# Patient Record
Sex: Male | Born: 1970 | Race: White | Hispanic: No | Marital: Married | Smoking: Former smoker
Health system: Southern US, Community
[De-identification: ages and names within clinical notes are randomized; demographics above are authoritative.]

## PROBLEM LIST (undated history)

## (undated) DIAGNOSIS — I10 Essential (primary) hypertension: Secondary | ICD-10-CM

---

## 2011-11-01 ENCOUNTER — Encounter (HOSPITAL_BASED_OUTPATIENT_CLINIC_OR_DEPARTMENT_OTHER): Payer: Self-pay | Admitting: *Deleted

## 2011-11-01 ENCOUNTER — Emergency Department (INDEPENDENT_AMBULATORY_CARE_PROVIDER_SITE_OTHER): Payer: BC Managed Care – PPO

## 2011-11-01 ENCOUNTER — Emergency Department (HOSPITAL_BASED_OUTPATIENT_CLINIC_OR_DEPARTMENT_OTHER)
Admission: EM | Admit: 2011-11-01 | Discharge: 2011-11-01 | Disposition: A | Discharge status: Home / Self Care | Payer: BC Managed Care – PPO | Attending: Emergency Medicine | Admitting: Emergency Medicine

## 2011-11-01 DIAGNOSIS — S8000XA Contusion of unspecified knee, initial encounter: Secondary | ICD-10-CM | POA: Insufficient documentation

## 2011-11-01 DIAGNOSIS — S81009A Unspecified open wound, unspecified knee, initial encounter: Secondary | ICD-10-CM | POA: Insufficient documentation

## 2011-11-01 DIAGNOSIS — S81809A Unspecified open wound, unspecified lower leg, initial encounter: Secondary | ICD-10-CM

## 2011-11-01 DIAGNOSIS — Y92410 Unspecified street and highway as the place of occurrence of the external cause: Secondary | ICD-10-CM | POA: Insufficient documentation

## 2011-11-01 DIAGNOSIS — M25569 Pain in unspecified knee: Secondary | ICD-10-CM

## 2011-11-01 DIAGNOSIS — M7989 Other specified soft tissue disorders: Secondary | ICD-10-CM

## 2011-11-01 DIAGNOSIS — S91009A Unspecified open wound, unspecified ankle, initial encounter: Secondary | ICD-10-CM | POA: Insufficient documentation

## 2011-11-01 DIAGNOSIS — R079 Chest pain, unspecified: Secondary | ICD-10-CM

## 2011-11-01 DIAGNOSIS — S81012A Laceration without foreign body, left knee, initial encounter: Secondary | ICD-10-CM

## 2011-11-01 DIAGNOSIS — S8001XA Contusion of right knee, initial encounter: Secondary | ICD-10-CM

## 2011-11-01 DIAGNOSIS — S20219A Contusion of unspecified front wall of thorax, initial encounter: Secondary | ICD-10-CM | POA: Insufficient documentation

## 2011-11-01 MED ORDER — OXYCODONE-ACETAMINOPHEN 5-325 MG PO TABS: 1.0000 | ORAL_TABLET | ORAL | Status: AC | PRN

## 2011-11-01 MED ORDER — OXYCODONE-ACETAMINOPHEN 5-325 MG PO TABS
1.0000 | ORAL_TABLET | Freq: Once | ORAL | Status: AC
  Administered 2011-11-01: 1 via ORAL
  Filled 2011-11-01: qty 1

## 2011-11-01 MED ORDER — TETANUS-DIPHTH-ACELL PERTUSSIS 5-2.5-18.5 LF-MCG/0.5 IM SUSP
0.5000 mL | Freq: Once | INTRAMUSCULAR | Status: AC
  Administered 2011-11-01: 0.5 mL via INTRAMUSCULAR
  Filled 2011-11-01: qty 0.5

## 2011-11-01 NOTE — ED Notes (Signed)
Patient transported to X-ray 

## 2011-11-01 NOTE — Discharge Instructions (Signed)
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 heartbeat, hives, dizziness, paleness, or swelling of the throat. What should I do?  Call a doctor, or get the person to a doctor right away.   Tell your doctor what happened, the date and time it happened, and when the vaccination was given.   Ask your provider to report the reaction by filing a Vaccine Adverse Event Reporting System (VAERS) form. Or, you can file this report through the VAERS website at www.vaers.LAgents.no or by calling 1-(226)063-7528.  VAERS does not provide medical advice. THE NATIONAL VACCINE INJURY COMPENSATION PROGRAM The National Vaccine Injury Compensation Program (VICP) was created in 1986. Persons who believe they may have been injured by a vaccine can learn about the program and about filing a claim by calling 1-(760)711-9123 or visiting the VICP website at SpiritualWord.at. HOW CAN I LEARN MORE?  Your doctor can give you the vaccine package insert or suggest other sources of information.   Call your local or state health department.   Contact  the Centers for Disease Control and Prevention (CDC):   Call (661) 303-9697 (1-800-CDC-INFO).   Visit the [facility] website at PicCapture.uy.  CDC Td and Tdap Interim VIS (1/12) Document Released: 06/24/2006 Document Revised: 05/15/2011 Document Reviewed: 10/03/2010 [facility] Patient Information 2012 [place], [place].
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
## 2011-11-01 NOTE — ED Notes (Signed)
MD at bedside. 

## 2011-11-01 NOTE — ED Notes (Signed)
Pt was ambulatory into dept, lac to left knee bleeding controlled by bandage. Wound irrigated and cleansed, bandage applied. Pt tolerated well.
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
## 2020-08-19 ENCOUNTER — Emergency Department (HOSPITAL_BASED_OUTPATIENT_CLINIC_OR_DEPARTMENT_OTHER)
Admission: EM | Admit: 2020-08-19 | Discharge: 2020-08-20 | Disposition: A | Discharge status: Home / Self Care | Payer: 59 | Attending: Emergency Medicine | Admitting: Emergency Medicine

## 2020-08-19 ENCOUNTER — Encounter (HOSPITAL_BASED_OUTPATIENT_CLINIC_OR_DEPARTMENT_OTHER): Payer: Self-pay | Admitting: Emergency Medicine

## 2020-08-19 ENCOUNTER — Other Ambulatory Visit: Payer: Self-pay

## 2020-08-19 ENCOUNTER — Emergency Department (HOSPITAL_BASED_OUTPATIENT_CLINIC_OR_DEPARTMENT_OTHER): Payer: 59

## 2020-08-19 DIAGNOSIS — Z87891 Personal history of nicotine dependence: Secondary | ICD-10-CM | POA: Insufficient documentation

## 2020-08-19 DIAGNOSIS — I10 Essential (primary) hypertension: Secondary | ICD-10-CM | POA: Diagnosis not present

## 2020-08-19 DIAGNOSIS — N200 Calculus of kidney: Secondary | ICD-10-CM | POA: Diagnosis not present

## 2020-08-19 DIAGNOSIS — Z79899 Other long term (current) drug therapy: Secondary | ICD-10-CM | POA: Diagnosis not present

## 2020-08-19 DIAGNOSIS — R1110 Vomiting, unspecified: Secondary | ICD-10-CM | POA: Diagnosis not present

## 2020-08-19 DIAGNOSIS — R1032 Left lower quadrant pain: Secondary | ICD-10-CM | POA: Diagnosis present

## 2020-08-19 HISTORY — DX: Essential (primary) hypertension: I10

## 2020-08-19 MED ORDER — SODIUM CHLORIDE 0.9 % IV BOLUS
1000.0000 mL | Freq: Once | INTRAVENOUS | Status: AC
  Administered 2020-08-20: 1000 mL via INTRAVENOUS

## 2020-08-19 MED ORDER — FENTANYL CITRATE (PF) 100 MCG/2ML IJ SOLN
50.0000 ug | Freq: Once | INTRAMUSCULAR | Status: AC
  Administered 2020-08-20: 50 ug via INTRAVENOUS
  Filled 2020-08-19: qty 2

## 2020-08-19 MED ORDER — ONDANSETRON HCL 4 MG/2ML IJ SOLN
4.0000 mg | Freq: Once | INTRAMUSCULAR | Status: AC
  Administered 2020-08-20: 4 mg via INTRAVENOUS
  Filled 2020-08-19: qty 2

## 2020-08-19 NOTE — ED Triage Notes (Signed)
Patient arrived via POV c/o left flank pain with nausea and emesis. Patient states onset at 1930 with 2 episodes of emesis. Patient took 2 pepto and tums gas relief pills. Patient states pain as 7/10. Patient is AO x 4, VS WDL, normal gait.

## 2020-08-19 NOTE — ED Provider Notes (Signed)
[facility] EMERGENCY DEPARTMENT Provider Note   CSN: [id number] Arrival date & time: 08/19/20  2249     History Chief Complaint  Patient presents with  . Flank Pain    Alexander Sampson is a 49 y.o. male.  Hx of kidney stones, left flank pain suddendly with emesis x2. No hematuria. Pain into LLQ of abdomen. No chest pain  The history is provided by the patient.  Flank Pain This is a new problem. The current episode started 3 to 5 hours ago. The problem occurs constantly. The problem has been gradually improving. Pertinent negatives include no chest pain, no abdominal pain and no shortness of breath. Nothing aggravates the symptoms. Nothing relieves the symptoms. He has tried nothing for the symptoms. The treatment provided no relief.       Past Medical History:  Diagnosis Date  . Hypertension     There are no problems to display for this patient.   History reviewed. No pertinent surgical history.     No family history on file.  Social History   Tobacco Use  . Smoking status: Former Games developer  . Smokeless tobacco: Never Used  Vaping Use  . Vaping Use: Never used  Substance Use Topics  . Alcohol use: No  . Drug use: No    Home Medications Prior to Admission medications   Medication Sig Start Date End Date Taking? Authorizing Provider  ondansetron (ZOFRAN) 4 MG tablet Take 1 tablet (4 mg total) by mouth every 6 (six) hours as needed for up to 20 doses for nausea or vomiting. 08/20/20   Karys Meckley, DO  oxyCODONE (ROXICODONE) 5 MG immediate release tablet Take 1 tablet (5 mg total) by mouth every 4 (four) hours as needed for up to 20 doses for severe pain. 08/20/20   Eknoor Novack, DO  tamsulosin (FLOMAX) 0.4 MG CAPS capsule Take 1 capsule (0.4 mg total) by mouth daily for 7 days. 08/20/20 08/27/20  Virgina Norfolk, DO    Allergies    Penicillins  Review of Systems   Review of Systems  Constitutional: Negative for chills and fever.  HENT: Negative  for ear pain and sore throat.   Eyes: Negative for pain and visual disturbance.  Respiratory: Negative for cough and shortness of breath.   Cardiovascular: Negative for chest pain and palpitations.  Gastrointestinal: Positive for nausea and vomiting. Negative for abdominal pain.  Genitourinary: Positive for flank pain. Negative for dysuria and hematuria.  Musculoskeletal: Negative for arthralgias and back pain.  Skin: Negative for color change and rash.  Neurological: Negative for seizures and syncope.  All other systems reviewed and are negative.   Physical Exam Updated Vital Signs  ED Triage Vitals  Enc Vitals Group     BP 08/19/20 2323 (!) 149/92     Pulse Rate 08/19/20 2323 (!) 51     Resp 08/19/20 2323 17     Temp 08/19/20 2323 98.1 F (36.7 C)     Temp Source 08/19/20 2323 Oral     SpO2 08/19/20 2323 98 %     Weight 08/19/20 2324 215 lb (97.5 kg)     Height 08/19/20 2324 6\' 1"  (1.854 m)     Head Circumference --      Peak Flow --      Pain Score 08/19/20 2324 7     Pain Loc --      Pain Edu? --      Excl. in GC? --     Physical Exam Vitals  and nursing note reviewed.  Constitutional:      General: He is not in acute distress.    Appearance: He is well-developed and well-nourished. He is not ill-appearing.  HENT:     Head: Normocephalic and atraumatic.     Nose: Nose normal.     Mouth/Throat:     Mouth: Mucous membranes are moist.  Eyes:     Extraocular Movements: Extraocular movements intact.     Conjunctiva/sclera: Conjunctivae normal.     Pupils: Pupils are equal, round, and reactive to light.  Cardiovascular:     Rate and Rhythm: Normal rate and regular rhythm.     Pulses: Normal pulses.     Heart sounds: Normal heart sounds. No murmur heard.   Pulmonary:     Effort: Pulmonary effort is normal. No respiratory distress.     Breath sounds: Normal breath sounds.  Abdominal:     Palpations: Abdomen is soft.     Tenderness: There is abdominal tenderness  (llq). There is left CVA tenderness.  Musculoskeletal:        General: No tenderness or edema. Normal range of motion.     Cervical back: Normal range of motion and neck supple.  Skin:    General: Skin is warm and dry.     Capillary Refill: Capillary refill takes less than 2 seconds.  Neurological:     General: No focal deficit present.     Mental Status: He is alert.  Psychiatric:        Mood and Affect: Mood and affect and mood normal.     ED Results / Procedures / Treatments   Labs (all labs ordered are listed, but only abnormal results are displayed) Labs Reviewed  CBC WITH DIFFERENTIAL/PLATELET - Abnormal; Notable for the following components:      Result Value   Neutro Abs 8.6 (*)    All other components within normal limits  COMPREHENSIVE METABOLIC PANEL - Abnormal; Notable for the following components:   Glucose, Bld 114 (*)    Alkaline Phosphatase 33 (*)    All other components within normal limits  LIPASE, BLOOD  URINALYSIS, ROUTINE W REFLEX MICROSCOPIC    EKG EKG Interpretation  Date/Time:  Saturday August 20 2020 00:23:45 EST Ventricular Rate:  76 PR Interval:    QRS Duration: 110 QT Interval:  388 QTC Calculation: 437 R Axis:   83 Text Interpretation: Sinus rhythm Borderline prolonged PR interval Probable inferior infarct, old Probable anterolateral infarct, old Confirmed by Virgina Norfolk [phone]) on 08/20/2020 12:26:44 AM   Radiology CT Renal Stone Study  Result Date: 08/20/2020 CLINICAL DATA:  Left flank pain and nausea. EXAM: CT ABDOMEN AND PELVIS WITHOUT CONTRAST TECHNIQUE: Multidetector CT imaging of the abdomen and pelvis was performed following the standard protocol without IV contrast. COMPARISON:  None. FINDINGS: Lower chest: No acute abnormality. Hepatobiliary: Numerous small, predominant cystic appearing areas are seen scattered throughout the liver parenchyma. The largest measures approximately 3.0 cm x 2.0 cm and is located within the right  lobe of the liver, adjacent to the gallbladder fossa. No gallstones, gallbladder wall thickening, or biliary dilatation. Pancreas: Unremarkable. No pancreatic ductal dilatation or surrounding inflammatory changes. Spleen: Normal in size without focal abnormality. Adrenals/Urinary Tract: Adrenal glands are unremarkable. Kidneys are normal in size, without focal lesions. A 3 mm nonobstructing renal stone is seen within the mid right kidney. A 4 mm obstructing renal stone is seen within the mid left ureter. There is mild left-sided hydronephrosis and hydroureter. Bladder is  unremarkable. Stomach/Bowel: Stomach is within normal limits. Appendix appears normal. No evidence of bowel wall thickening, distention, or inflammatory changes. Vascular/Lymphatic: No significant vascular findings are present. Subcentimeter para-aortic and aortocaval lymph nodes are noted. Reproductive: The prostate gland is markedly enlarged. Other: There is a 2.4 cm x 2.4 cm fat containing right inguinal hernia. No abdominopelvic ascites. Musculoskeletal: No acute or significant osseous findings. IMPRESSION: 1. 4 mm obstructing renal stone within the mid left ureter. 2. 3 mm nonobstructing renal stone within the mid right kidney. 3. Findings likely consistent with multiple hepatic cysts. 4. Fat containing right inguinal hernia. 5. Markedly enlarged prostate gland. Electronically Signed   By: Aram Candela M.D.   On: 08/20/2020 00:16    Procedures Procedures (including critical care time)  Medications Ordered in ED Medications  sodium chloride 0.9 % bolus 1,000 mL (1,000 mLs Intravenous New Bag/Given 08/20/20 0004)  fentaNYL (SUBLIMAZE) injection 50 mcg (50 mcg Intravenous Given 08/20/20 0007)  ondansetron (ZOFRAN) injection 4 mg (4 mg Intravenous Given 08/20/20 0005)    ED Course  I have reviewed the triage vital signs and the nursing notes.  Pertinent labs & imaging results that were available during my care of the patient  were reviewed by me and considered in my medical decision making (see chart for details).    MDM Rules/Calculators/A&P                          Jachin Coury is a 49 year old male who presents to the ED with abdominal pain/flank pain.  History of kidney stones.  Normal vitals.  No fever.  Pain for the last several hours with episodes of emesis.  Concern for kidney stone versus diverticulitis versus pyelonephritis/UTI.  We will get basic labs, CT scan of abdomen and pelvis.  Will get patient IV fluids, IV fentanyl, IV Zofran and reevaluate.  CT scan shows 4 mm obstructing left renal stone.  Otherwise unremarkable imaging.  Patient with no significant leukocytosis, anemia, electrolyte abnormality, kidney injury.  Urinalysis negative for infection.  Reassuring vitals.  Felt improved following interventions.  Prescribed pain medicine, Zofran, Flomax.  Educated about kidney stones.  Understands return precautions and discharged in ED in good condition.  This chart was dictated using voice recognition software.  Despite best efforts to proofread,  errors can occur which can change the documentation meaning.    Final Clinical Impression(s) / ED Diagnoses Final diagnoses:  Kidney stone    Rx / DC Orders ED Discharge Orders         Ordered    oxyCODONE (ROXICODONE) 5 MG immediate release tablet  Every 4 hours PRN        08/20/20 0031    tamsulosin (FLOMAX) 0.4 MG CAPS capsule  Daily        08/20/20 0031    ondansetron (ZOFRAN) 4 MG tablet  Every 6 hours PRN        08/20/20 0031           Virgina Norfolk, DO 08/20/20 0032

## 2020-08-20 LAB — CBC WITH DIFFERENTIAL/PLATELET
Abs Immature Granulocytes: 0.03 10*3/uL (ref 0.00–0.07)
Basophils Absolute: 0.1 10*3/uL (ref 0.0–0.1)
Basophils Relative: 1 %
Eosinophils Absolute: 0.1 10*3/uL (ref 0.0–0.5)
Eosinophils Relative: 1 %
HCT: 41.3 % (ref 39.0–52.0)
Hemoglobin: 14 g/dL (ref 13.0–17.0)
Immature Granulocytes: 0 %
Lymphocytes Relative: 8 %
Lymphs Abs: 0.8 10*3/uL (ref 0.7–4.0)
MCH: 29.7 pg (ref 26.0–34.0)
MCHC: 33.9 g/dL (ref 30.0–36.0)
MCV: 87.5 fL (ref 80.0–100.0)
Monocytes Absolute: 0.3 10*3/uL (ref 0.1–1.0)
Monocytes Relative: 3 %
Neutro Abs: 8.6 10*3/uL — ABNORMAL HIGH (ref 1.7–7.7)
Neutrophils Relative %: 87 %
Platelets: 245 10*3/uL (ref 150–400)
RBC: 4.72 MIL/uL (ref 4.22–5.81)
RDW: 13.4 % (ref 11.5–15.5)
WBC: 9.8 10*3/uL (ref 4.0–10.5)
nRBC: 0 % (ref 0.0–0.2)

## 2020-08-20 LAB — URINALYSIS, MICROSCOPIC (REFLEX): RBC / HPF: >50 RBC/hpf (ref 0–5)

## 2020-08-20 LAB — COMPREHENSIVE METABOLIC PANEL
ALT: 26 U/L (ref 0–44)
AST: 25 U/L (ref 15–41)
Albumin: 4.6 g/dL (ref 3.5–5.0)
Alkaline Phosphatase: 33 U/L — ABNORMAL LOW (ref 38–126)
Anion gap: 8 (ref 5–15)
BUN: 15 mg/dL (ref 6–20)
CO2: 26 mmol/L (ref 22–32)
Calcium: 9.5 mg/dL (ref 8.9–10.3)
Chloride: 106 mmol/L (ref 98–111)
Creatinine, Ser: 0.88 mg/dL (ref 0.61–1.24)
GFR, Estimated: >60 mL/min (ref >60)
Glucose, Bld: 114 mg/dL — ABNORMAL HIGH (ref 70–99)
Potassium: 3.8 mmol/L (ref 3.5–5.1)
Sodium: 140 mmol/L (ref 135–145)
Total Bilirubin: 0.7 mg/dL (ref 0.3–1.2)
Total Protein: 7.2 g/dL (ref 6.5–8.1)

## 2020-08-20 LAB — URINALYSIS, ROUTINE W REFLEX MICROSCOPIC
Bilirubin Urine: NEGATIVE
Glucose, UA: NEGATIVE mg/dL
Ketones, ur: NEGATIVE mg/dL
Leukocytes,Ua: NEGATIVE
Nitrite: NEGATIVE
Protein, ur: NEGATIVE mg/dL
Specific Gravity, Urine: 1.02 (ref 1.005–1.030)
pH: 7.5 (ref 5.0–8.0)

## 2020-08-20 LAB — LIPASE, BLOOD: Lipase: 29 U/L (ref 11–51)

## 2020-08-20 MED ORDER — TAMSULOSIN HCL 0.4 MG PO CAPS: 0.4000 mg | ORAL_CAPSULE | Freq: Every day | ORAL | 0 refills | Status: AC

## 2020-08-20 MED ORDER — OXYCODONE HCL 5 MG PO TABS: 5.0000 mg | ORAL_TABLET | ORAL | 0 refills | Status: AC | PRN

## 2020-08-20 MED ORDER — ONDANSETRON HCL 4 MG PO TABS: 4.0000 mg | ORAL_TABLET | Freq: Four times a day (QID) | ORAL | 0 refills | Status: AC | PRN

## 2020-08-20 MED ORDER — OXYCODONE HCL 5 MG PO TABS
5.0000 mg | ORAL_TABLET | Freq: Once | ORAL | Status: AC
  Administered 2020-08-20: 5 mg via ORAL
  Filled 2020-08-20: qty 1

## 2020-08-20 NOTE — Discharge Instructions (Signed)
Please return if you develop worsening pain, fever, uncontrollable nausea and vomiting

## 2022-08-22 IMAGING — CT CT RENAL STONE PROTOCOL
2 of 4 series · 16 of 46 positions shown, 18 images · non-contrast
Comparison: None.

CLINICAL DATA: Left flank pain and nausea.

EXAM:
CT ABDOMEN AND PELVIS WITHOUT CONTRAST
TECHNIQUE: Multidetector CT imaging of the abdomen and pelvis was performed
following the standard protocol without IV contrast.

[Series 2: axial st · axial · 0.76mm/px · z∈[+497,+957]mm · 13 of 100 slices shown, 15 images]
[im 4/100  soft-tissue]
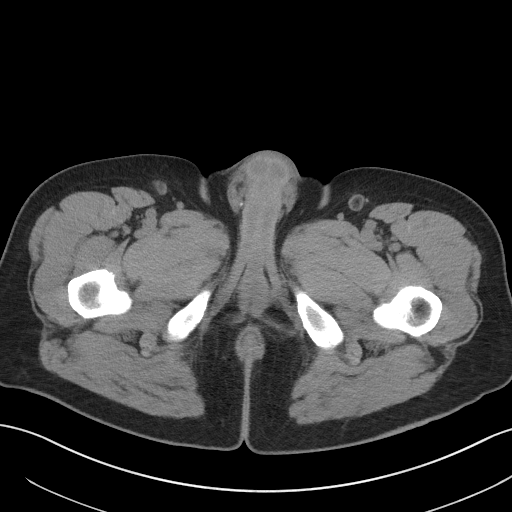
[im 4/100  bone]
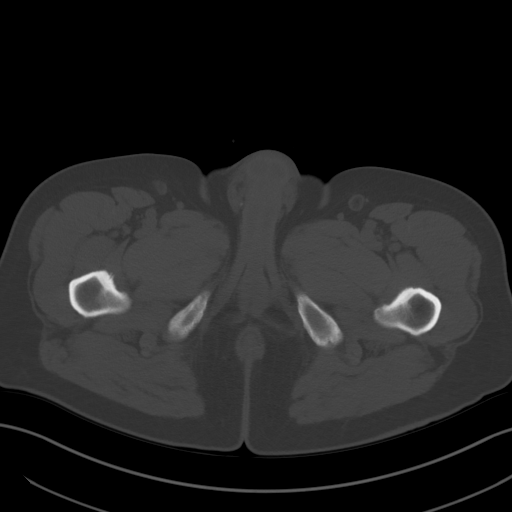
[im 12/100  soft-tissue]
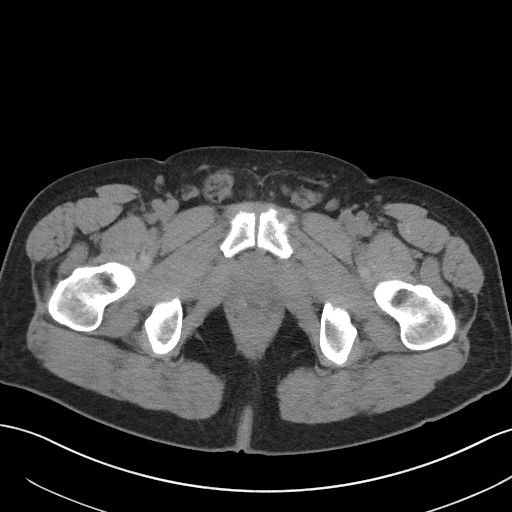
[im 20/100  soft-tissue]
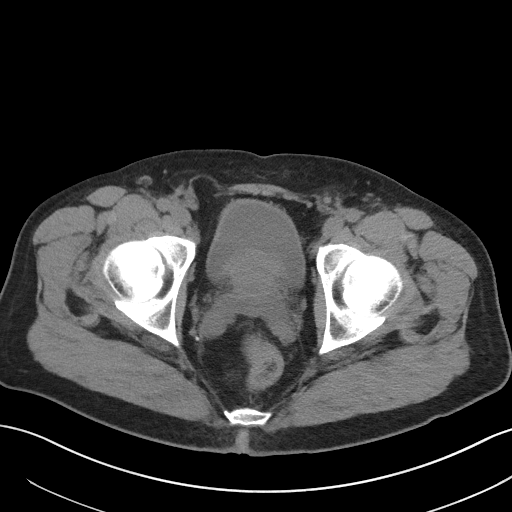
[im 28/100  soft-tissue]
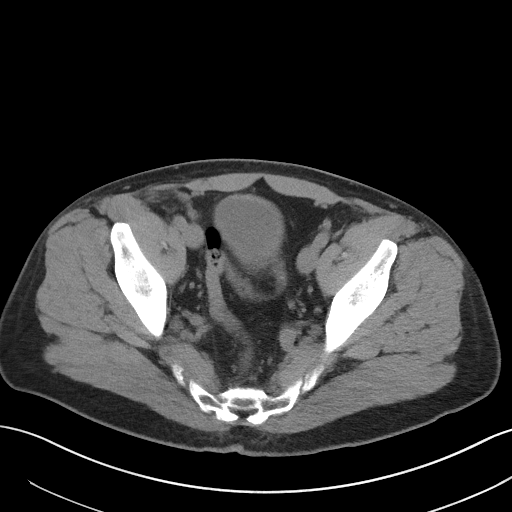
[im 36/100  soft-tissue]
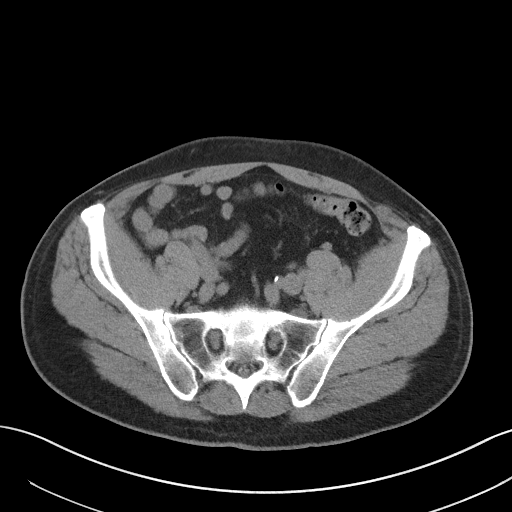
[im 44/100  soft-tissue]
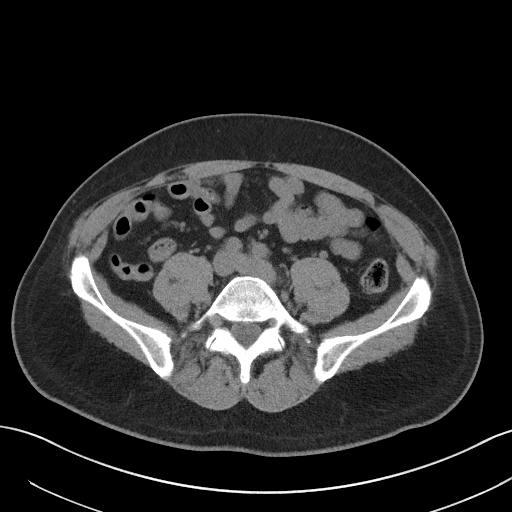
[im 52/100  soft-tissue]
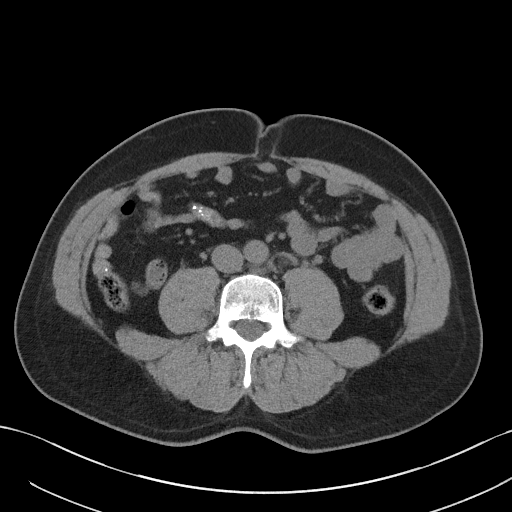
[im 56/100  soft-tissue]
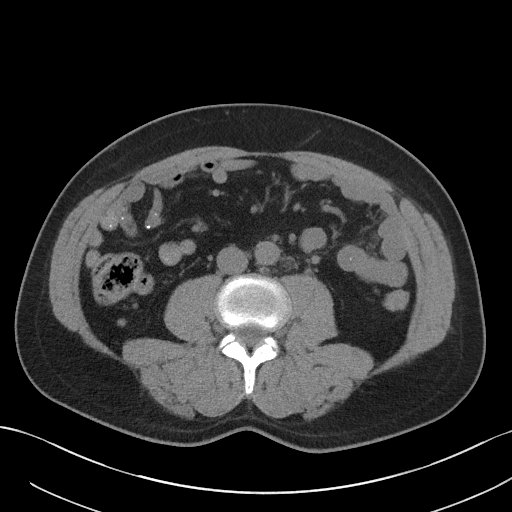
[im 64/100  soft-tissue]
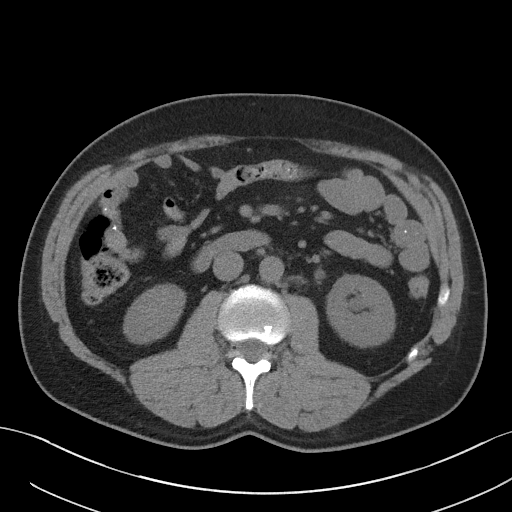
[im 64/100  bone]
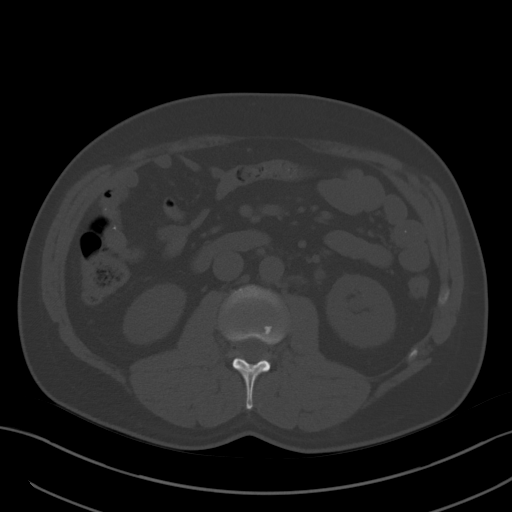
[im 72/100  soft-tissue]
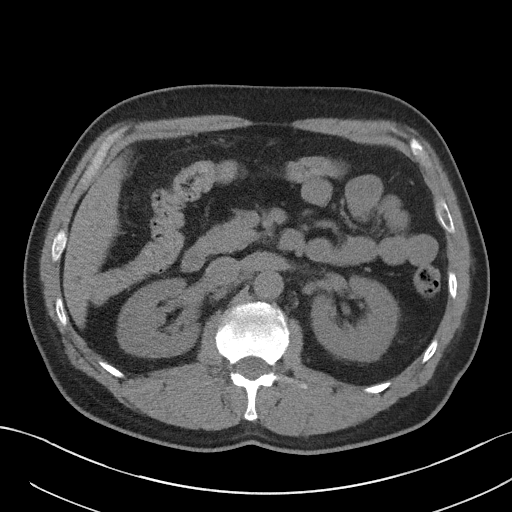
[im 80/100  soft-tissue]
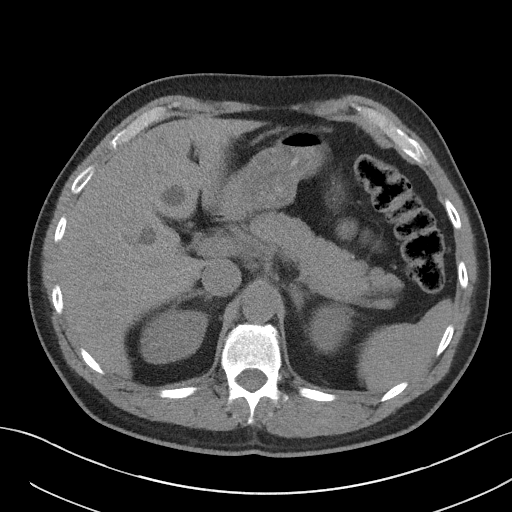
[im 88/100  soft-tissue]
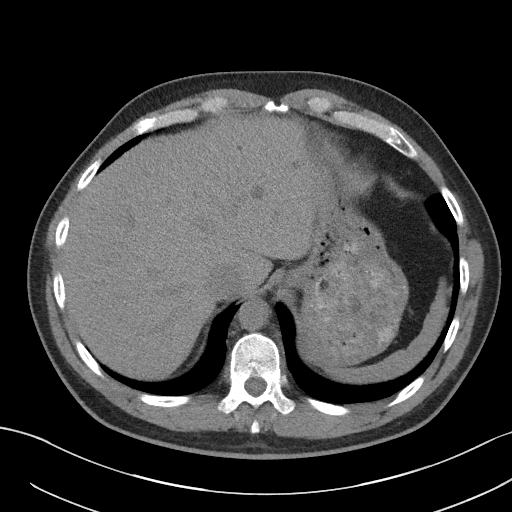
[im 96/100  soft-tissue]
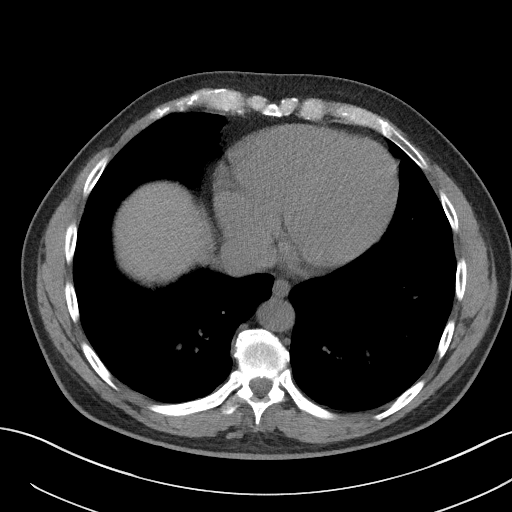

[Series 4: coronal st · coronal · 0.73mm/px · 3 of 90 slices shown]
[im 30/90  soft-tissue]
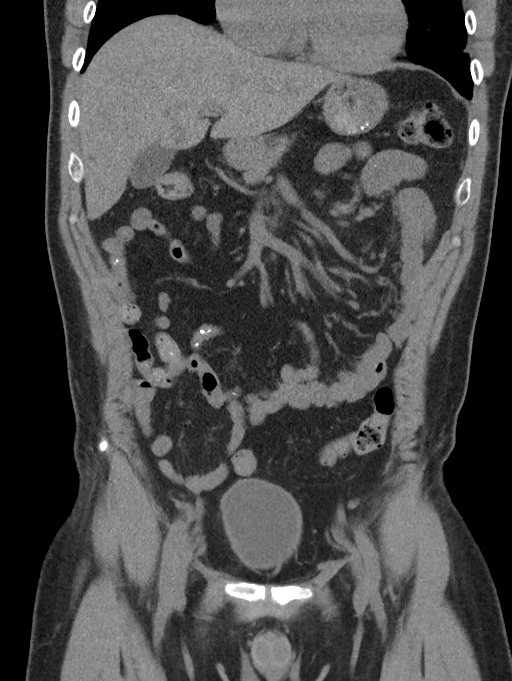
[im 40/90  soft-tissue]
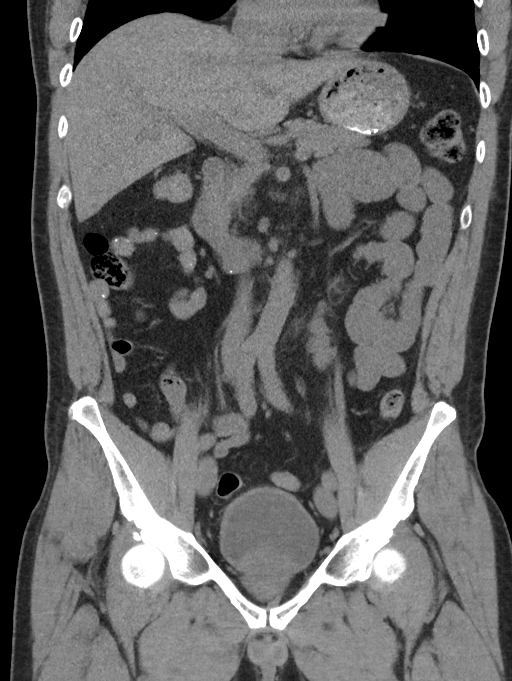
[im 50/90  soft-tissue]
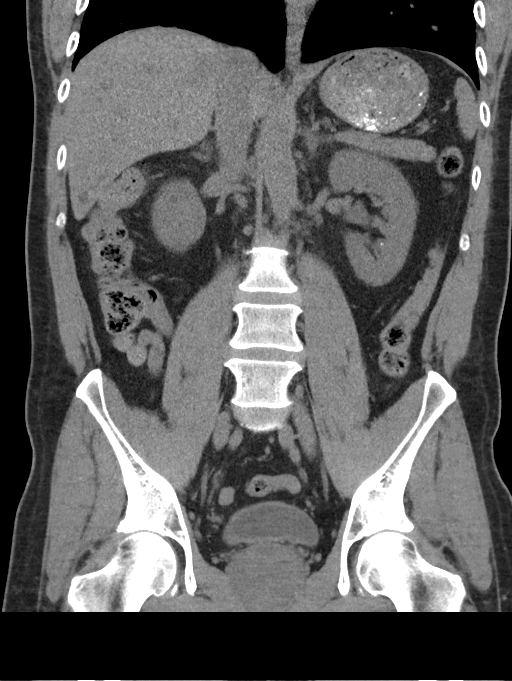

[16 of 46 positions shown; findings below may reference images not displayed]

FINDINGS: Lower chest: No acute abnormality.

Hepatobiliary: Numerous small, predominant cystic appearing areas
are seen scattered throughout the liver parenchyma. The largest
measures approximately 3.0 cm x 2.0 cm and is located within the
right lobe of the liver, adjacent to the gallbladder fossa. No
gallstones, gallbladder wall thickening, or biliary dilatation.

Pancreas: Unremarkable. No pancreatic ductal dilatation or
surrounding inflammatory changes.

Spleen: Normal in size without focal abnormality.

Adrenals/Urinary Tract: Adrenal glands are unremarkable. Kidneys are
normal in size, without focal lesions. A 3 mm nonobstructing renal
stone is seen within the mid right kidney. A 4 mm obstructing renal
stone is seen within the mid left ureter. There is mild left-sided
hydronephrosis and hydroureter. Bladder is unremarkable.

Stomach/Bowel: Stomach is within normal limits. Appendix appears
normal. No evidence of bowel wall thickening, distention, or
inflammatory changes.

Vascular/Lymphatic: No significant vascular findings are present.
Subcentimeter para-aortic and aortocaval lymph nodes are noted.

Reproductive: The prostate gland is markedly enlarged.

Other: There is a 2.4 cm x 2.4 cm fat containing right inguinal
hernia. No abdominopelvic ascites.

Musculoskeletal: No acute or significant osseous findings.
IMPRESSION: 1. 4 mm obstructing renal stone within the mid left ureter.
2. 3 mm nonobstructing renal stone within the mid right kidney.
3. Findings likely consistent with multiple hepatic cysts.
4. Fat containing right inguinal hernia.
5. Markedly enlarged prostate gland.
